# Patient Record
Sex: Male | Born: 1970 | Race: White | Hispanic: No | Marital: Married | Smoking: Never smoker
Health system: Southern US, Community
[De-identification: ages and names within clinical notes are randomized; demographics above are authoritative.]

## PROBLEM LIST (undated history)

## (undated) DIAGNOSIS — N200 Calculus of kidney: Secondary | ICD-10-CM

## (undated) DIAGNOSIS — M109 Gout, unspecified: Secondary | ICD-10-CM

## (undated) DIAGNOSIS — I10 Essential (primary) hypertension: Secondary | ICD-10-CM

---

## 2013-03-22 ENCOUNTER — Encounter (HOSPITAL_COMMUNITY): Payer: Self-pay | Admitting: Emergency Medicine

## 2013-03-22 ENCOUNTER — Emergency Department (HOSPITAL_COMMUNITY)
Admission: EM | Admit: 2013-03-22 | Discharge: 2013-03-22 | Disposition: A | Discharge status: Home / Self Care | Payer: 59 | Attending: Emergency Medicine | Admitting: Emergency Medicine

## 2013-03-22 ENCOUNTER — Emergency Department (HOSPITAL_COMMUNITY): Payer: 59

## 2013-03-22 DIAGNOSIS — N5082 Scrotal pain: Secondary | ICD-10-CM

## 2013-03-22 DIAGNOSIS — R319 Hematuria, unspecified: Secondary | ICD-10-CM | POA: Insufficient documentation

## 2013-03-22 DIAGNOSIS — Z79899 Other long term (current) drug therapy: Secondary | ICD-10-CM | POA: Insufficient documentation

## 2013-03-22 DIAGNOSIS — I10 Essential (primary) hypertension: Secondary | ICD-10-CM | POA: Insufficient documentation

## 2013-03-22 DIAGNOSIS — Z87442 Personal history of urinary calculi: Secondary | ICD-10-CM | POA: Insufficient documentation

## 2013-03-22 DIAGNOSIS — M109 Gout, unspecified: Secondary | ICD-10-CM | POA: Insufficient documentation

## 2013-03-22 DIAGNOSIS — N509 Disorder of male genital organs, unspecified: Secondary | ICD-10-CM | POA: Insufficient documentation

## 2013-03-22 DIAGNOSIS — Z880 Allergy status to penicillin: Secondary | ICD-10-CM | POA: Insufficient documentation

## 2013-03-22 DIAGNOSIS — N5089 Other specified disorders of the male genital organs: Secondary | ICD-10-CM | POA: Insufficient documentation

## 2013-03-22 HISTORY — DX: Gout, unspecified: M10.9

## 2013-03-22 HISTORY — DX: Calculus of kidney: N20.0

## 2013-03-22 HISTORY — DX: Essential (primary) hypertension: I10

## 2013-03-22 LAB — CBC WITH DIFFERENTIAL/PLATELET
BASOS ABS: 0 10*3/uL (ref 0.0–0.1)
BASOS PCT: 0 % (ref 0–1)
Eosinophils Absolute: 0.1 10*3/uL (ref 0.0–0.7)
Eosinophils Relative: 2 % (ref 0–5)
HCT: 38.6 % — ABNORMAL LOW (ref 39.0–52.0)
Hemoglobin: 13.9 g/dL (ref 13.0–17.0)
LYMPHS PCT: 24 % (ref 12–46)
Lymphs Abs: 1.8 10*3/uL (ref 0.7–4.0)
MCH: 32.3 pg (ref 26.0–34.0)
MCHC: 36 g/dL (ref 30.0–36.0)
MCV: 89.6 fL (ref 78.0–100.0)
MONOS PCT: 12 % (ref 3–12)
Monocytes Absolute: 0.9 10*3/uL (ref 0.1–1.0)
NEUTROS ABS: 4.8 10*3/uL (ref 1.7–7.7)
NEUTROS PCT: 62 % (ref 43–77)
Platelets: 280 10*3/uL (ref 150–400)
RBC: 4.31 MIL/uL (ref 4.22–5.81)
RDW: 12.3 % (ref 11.5–15.5)
WBC: 7.6 10*3/uL (ref 4.0–10.5)

## 2013-03-22 LAB — URINE MICROSCOPIC-ADD ON

## 2013-03-22 LAB — URINALYSIS, ROUTINE W REFLEX MICROSCOPIC
Bilirubin Urine: NEGATIVE
Glucose, UA: NEGATIVE mg/dL
Ketones, ur: NEGATIVE mg/dL
Leukocytes, UA: NEGATIVE
NITRITE: NEGATIVE
PH: 5.5 (ref 5.0–8.0)
Protein, ur: NEGATIVE mg/dL
SPECIFIC GRAVITY, URINE: 1.017 (ref 1.005–1.030)
Urobilinogen, UA: 0.2 mg/dL (ref 0.0–1.0)

## 2013-03-22 LAB — BASIC METABOLIC PANEL
BUN: 19 mg/dL (ref 6–23)
CHLORIDE: 96 meq/L (ref 96–112)
CO2: 25 mEq/L (ref 19–32)
Calcium: 10 mg/dL (ref 8.4–10.5)
Creatinine, Ser: 0.95 mg/dL (ref 0.50–1.35)
GFR calc Af Amer: >90 mL/min (ref >90)
GFR calc non Af Amer: >90 mL/min (ref >90)
Glucose, Bld: 87 mg/dL (ref 70–99)
POTASSIUM: 4 meq/L (ref 3.7–5.3)
Sodium: 137 mEq/L (ref 137–147)

## 2013-03-22 MED ORDER — OXYCODONE-ACETAMINOPHEN 5-325 MG PO TABS: 2.0000 | ORAL_TABLET | ORAL | Status: AC | PRN

## 2013-03-22 NOTE — ED Provider Notes (Signed)
CSN: [id number]     Arrival date & time 03/22/13  1744 History   First MD Initiated Contact with Patient 03/22/13 1900     Chief Complaint  Patient presents with  . Groin Swelling    left     (Consider location/radiation/quality/duration/timing/severity/associated sxs/prior Treatment) HPI Comments: Patient is a 43 year old male who presents with a 3 day history of left scrotal swelling. Symptoms started gradually and progressively worsened since the onset. Patient denies any pain but does report "discomfort" in the area. Patient has not tried anything for symptom relief. No aggravating/alleviating factors. Patient denies any associated symptoms.    Past Medical History  Diagnosis Date  . Kidney stone   . Hypertension   . Gout    History reviewed. No pertinent past surgical history. No family history on file. History  Substance Use Topics  . Smoking status: Never Smoker   . Smokeless tobacco: Not on file  . Alcohol Use: No     Comment: occasion    Review of Systems  Constitutional: Negative for fever, chills and fatigue.  HENT: Negative for trouble swallowing.   Eyes: Negative for visual disturbance.  Respiratory: Negative for shortness of breath.   Cardiovascular: Negative for chest pain and palpitations.  Gastrointestinal: Negative for nausea, vomiting, abdominal pain and diarrhea.  Genitourinary: Positive for scrotal swelling. Negative for dysuria and difficulty urinating.  Musculoskeletal: Negative for arthralgias and neck pain.  Skin: Negative for color change.  Neurological: Negative for dizziness and weakness.  Psychiatric/Behavioral: Negative for dysphoric mood.      Allergies  Penicillins  Home Medications   Current Outpatient Rx  Name  Route  Sig  Dispense  Refill  . allopurinol (ZYLOPRIM) 300 MG tablet   Oral   Take 300 mg by mouth daily.         Marland Kitchen. COLCRYS 0.6 MG tablet   Oral   Take 0.6 mg by mouth daily.          Marland Kitchen. lisinopril  (PRINIVIL,ZESTRIL) 40 MG tablet   Oral   Take 40 mg by mouth daily.          BP 138/89  Pulse 86  Temp(Src) 97.9 F (36.6 C) (Oral)  Resp 18  SpO2 99% Physical Exam  Nursing note and vitals reviewed. Constitutional: He is oriented to person, place, and time. He appears well-developed and well-nourished. No distress.  HENT:  Head: Normocephalic and atraumatic.  Eyes: Conjunctivae and EOM are normal.  Neck: Normal range of motion.  Cardiovascular: Normal rate and regular rhythm.  Exam reveals no gallop and no friction rub.   No murmur heard. Pulmonary/Chest: Effort normal and breath sounds normal. He has no wheezes. He has no rales. He exhibits no tenderness.  Abdominal: Soft. He exhibits no distension. There is no tenderness. There is no rebound and no guarding.  Genitourinary: Penis normal.  Left testicular swelling without tenderness to palpation. Unremarkable right testicle.   Musculoskeletal: Normal range of motion.  Neurological: He is alert and oriented to person, place, and time.  Speech is goal-oriented. Moves limbs without ataxia.   Skin: Skin is warm and dry.  Psychiatric: He has a normal mood and affect. His behavior is normal.    ED Course  Procedures (including critical care time) Labs Review Labs Reviewed  CBC WITH DIFFERENTIAL - Abnormal; Notable for the following:    HCT 38.6 (*)    All other components within normal limits  URINALYSIS, ROUTINE W REFLEX MICROSCOPIC - Abnormal; Notable  for the following:    Hgb urine dipstick LARGE (*)    All other components within normal limits  BASIC METABOLIC PANEL  URINE MICROSCOPIC-ADD ON   Imaging Review US Scrotum  03/22/2013   CLINICAL DATA:  Left scrotal pain and swelling.  EXAM: SCROTAL ULTRASOUND  DOPPLER ULTRASOUND OF THE TESTICLES  TECHNIQUE: Complete ultrasound examination of the testicles, epididymis, and other scrotal structures was performed. Color and spectral Doppler ultrasound were also utilized to  evaluate blood flow to the testicles.  COMPARISON:  None.  FINDINGS: Right testicle  Measurements: Approximately 4.2 x 2.6 x 3.0 cm. Normal parenchymal echotexture without mass or microlithiasis. Normal color Doppler flow without evidence of hyperemia.  Left testicle  Measurements: Approximately 4.3 x 2.4 x 2.6 cm. Normal parenchymal echotexture without mass or microlithiasis. Normal color Doppler flow without evidence of hyperemia.  Right epididymis: Normal in size and appearance without evidence of hyperemia.  Left epididymis: Normal in size and appearance without evidence of hyperemia.  Hydrocele:  None visualized.  Varicocele:  None visualized.  Pulsed Doppler interrogation of both testes demonstrates normal low resistance arterial and venous waveforms bilaterally.  IMPRESSION: Normal examination. Specifically, no evidence of testicular torsion or epididymo-orchitis.   Electronically Signed   By: Hulan Saas M.D.   On: 03/22/2013 20:33   [place] Art/ven Flow Abd Pelv Doppler  03/22/2013   CLINICAL DATA:  Left scrotal pain and swelling.  EXAM: SCROTAL ULTRASOUND  DOPPLER ULTRASOUND OF THE TESTICLES  TECHNIQUE: Complete ultrasound examination of the testicles, epididymis, and other scrotal structures was performed. Color and spectral Doppler ultrasound were also utilized to evaluate blood flow to the testicles.  COMPARISON:  None.  FINDINGS: Right testicle  Measurements: Approximately 4.2 x 2.6 x 3.0 cm. Normal parenchymal echotexture without mass or microlithiasis. Normal color Doppler flow without evidence of hyperemia.  Left testicle  Measurements: Approximately 4.3 x 2.4 x 2.6 cm. Normal parenchymal echotexture without mass or microlithiasis. Normal color Doppler flow without evidence of hyperemia.  Right epididymis: Normal in size and appearance without evidence of hyperemia.  Left epididymis: Normal in size and appearance without evidence of hyperemia.  Hydrocele:  None visualized.  Varicocele:  None  visualized.  Pulsed Doppler interrogation of both testes demonstrates normal low resistance arterial and venous waveforms bilaterally.  IMPRESSION: Normal examination. Specifically, no evidence of testicular torsion or epididymo-orchitis.   Electronically Signed   By: Hulan Saas M.D.   On: 03/22/2013 20:33    EKG Interpretation   None       MDM   Final diagnoses:  Scrotum pain  Hematuria    7:18 PM Scrotum US pending. Vitals stable and patient afebrile. Patient declines pain medication at this time.   9:14 PM Scrotum US unremarkable for acute changes. Urinalysis shows large hemoglobin. Patient complaining of some LLQ pain after further questions. Patient may have a kidney stone but does not want to have a CT scan. Patient will be discharged with Percocet for pain as needed, Urology follow up, and instructions to return with worsening or concerning symptoms. No further evaluation needed at this time.   Emilia Beck, [place] 03/27/13 [phone]

## 2013-03-22 NOTE — Discharge Instructions (Signed)
Take Percocet as needed for pain. Follow up with [facility] for further evaluation. Refer to attached documents for more information. Return to the ED with worsening or concerning symptoms.

## 2013-03-22 NOTE — ED Notes (Signed)
Pt states that when he woke up two days ago he noticed left groin swelling. Pt took ibuprofen and thought it went down and was better but it's not. Pt states that painful when in sitting position. Pt denies any problems with urination, just doesn't feel that he is emptying his bladder all the way so has frequency with urination. Pt states that he also has constipation.

## 2013-03-27 NOTE — ED Provider Notes (Signed)
Medical screening examination/treatment/procedure(s) were performed by non-physician practitioner and as supervising physician I was immediately available for consultation/collaboration.  EKG Interpretation   None         Candyce ChurnJohn David Chasitee Zenker III, MD 03/27/13 [phone]

## 2013-04-06 ENCOUNTER — Telehealth (HOSPITAL_COMMUNITY): Payer: Self-pay

## 2013-04-06 NOTE — ED Notes (Signed)
Pt calling for information on how to access his My Chart reports doesn't have DC paperwork.  Provided pt w/My chart support line # [phone]

## 2015-01-31 IMAGING — US US SCROTUM
1 series · 14 of 25 positions shown · non-contrast
Comparison: None.

CLINICAL DATA: Left scrotal pain and swelling.

EXAM:
SCROTAL ULTRASOUND
DOPPLER ULTRASOUND OF THE TESTICLES
TECHNIQUE: Complete ultrasound examination of the testicles, epididymis, and
other scrotal structures was performed. Color and spectral Doppler
ultrasound were also utilized to evaluate blood flow to the
testicles.

[Series 1: us scrotum · 0.07mm/px · 14 of 55 slices shown]
[im 1/55]
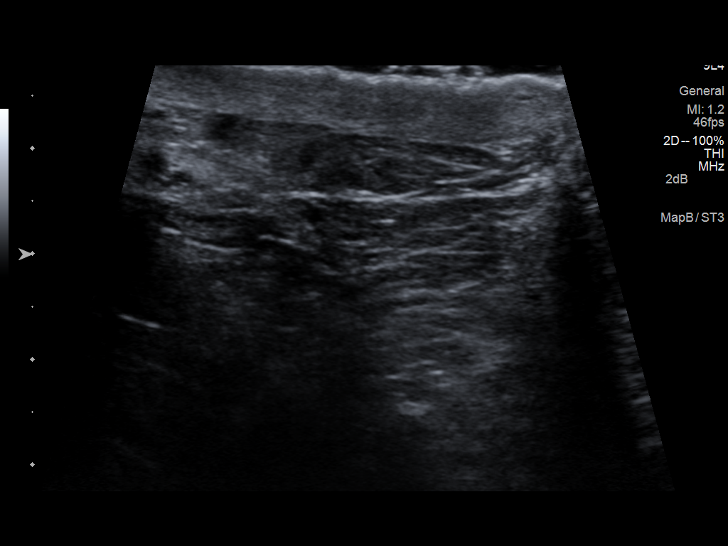
[im 5/55]
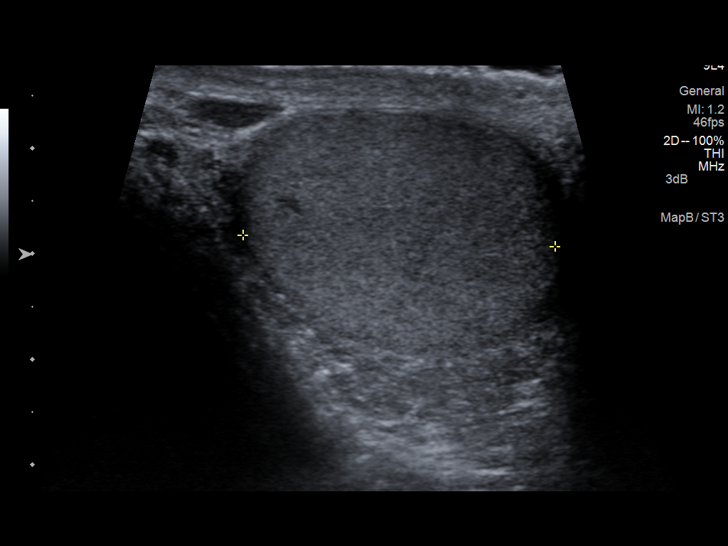
[im 10/55]
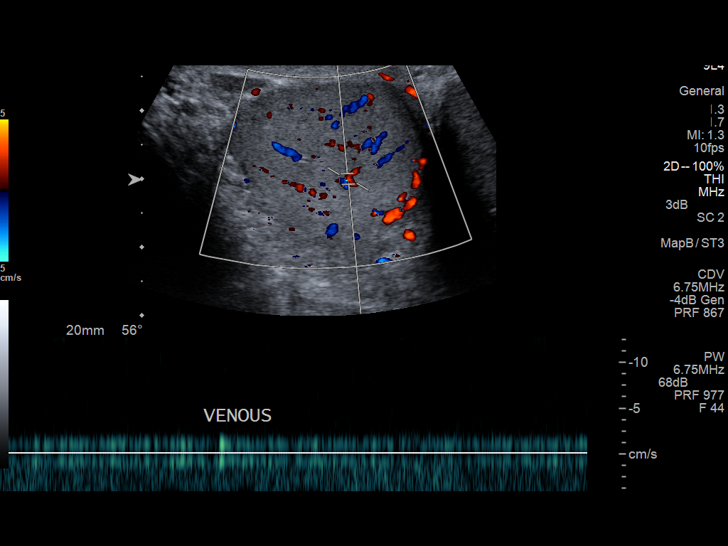
[im 14/55]
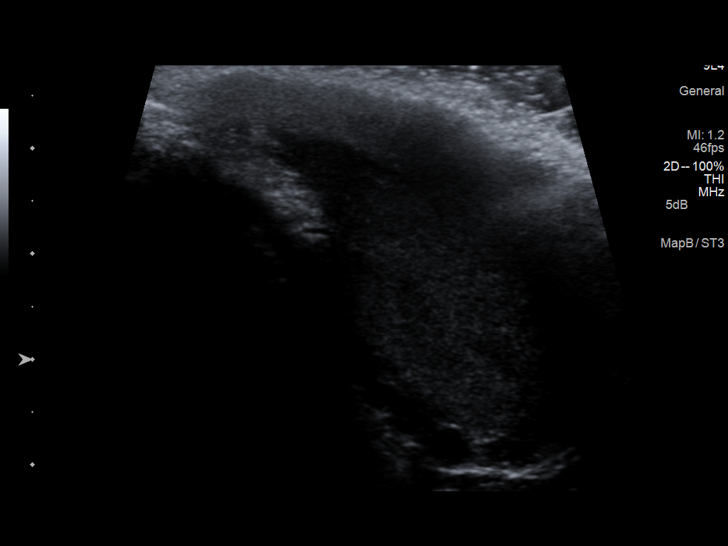
[im 19/55]
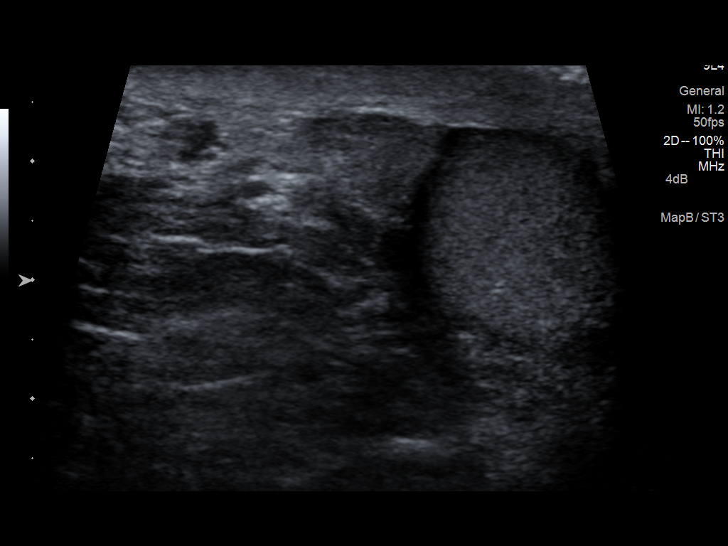
[im 21/55]
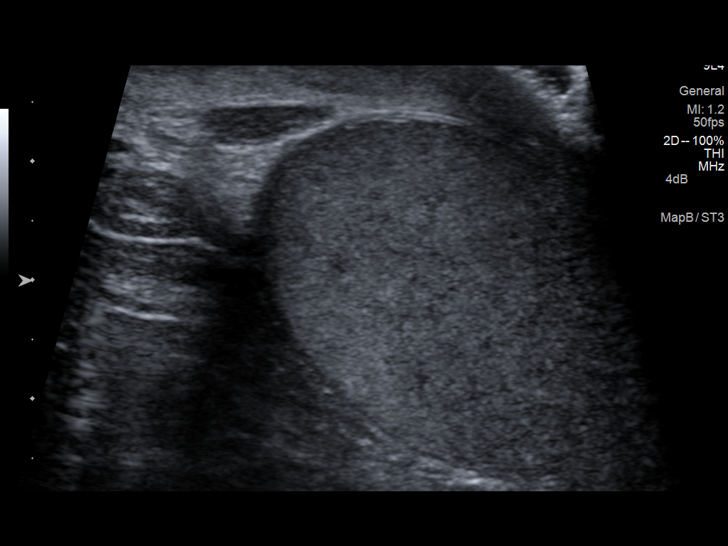
[im 25/55]
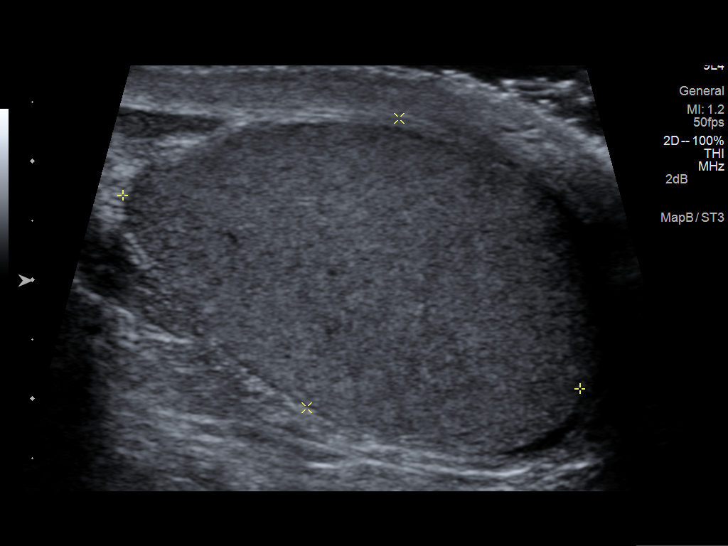
[im 30/55]
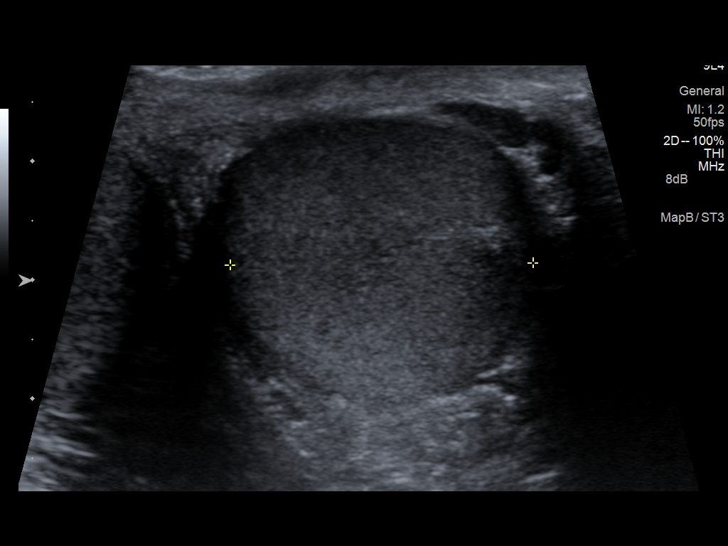
[im 34/55]
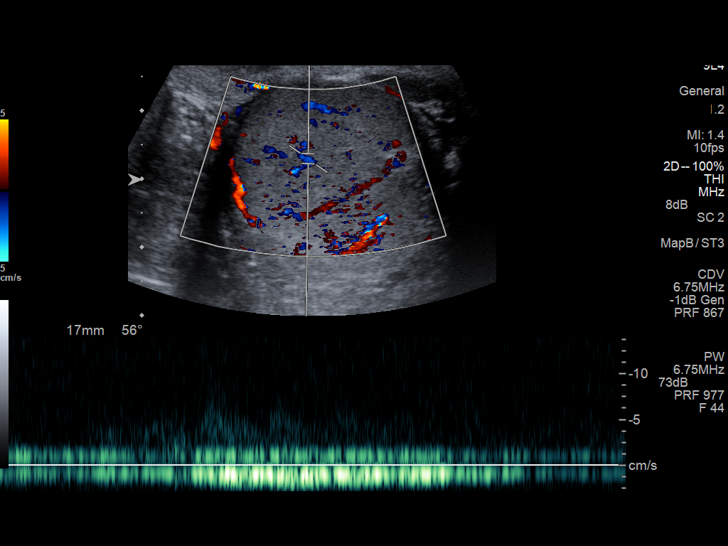
[im 37/55]
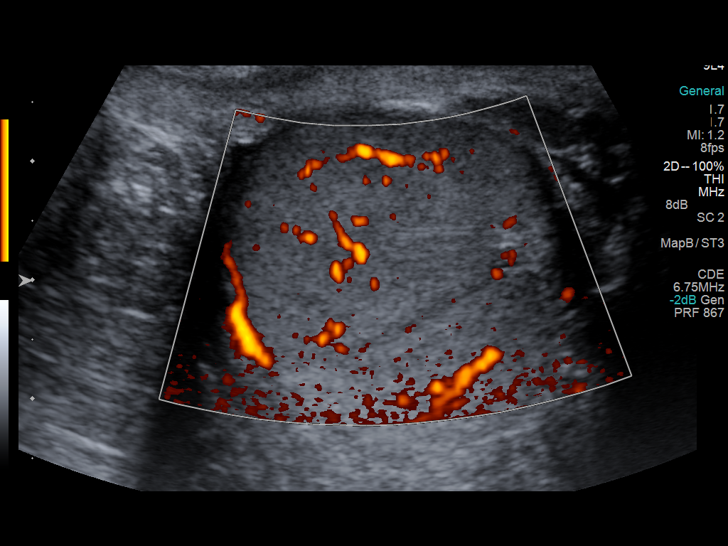
[im 41/55]
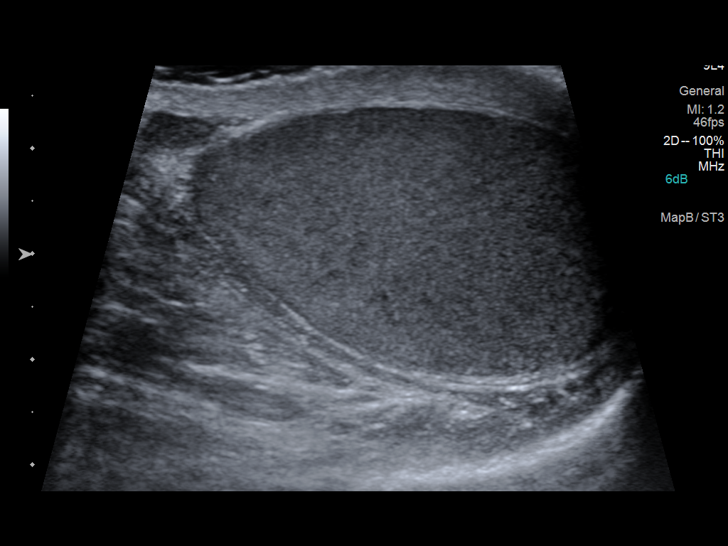
[im 46/55]
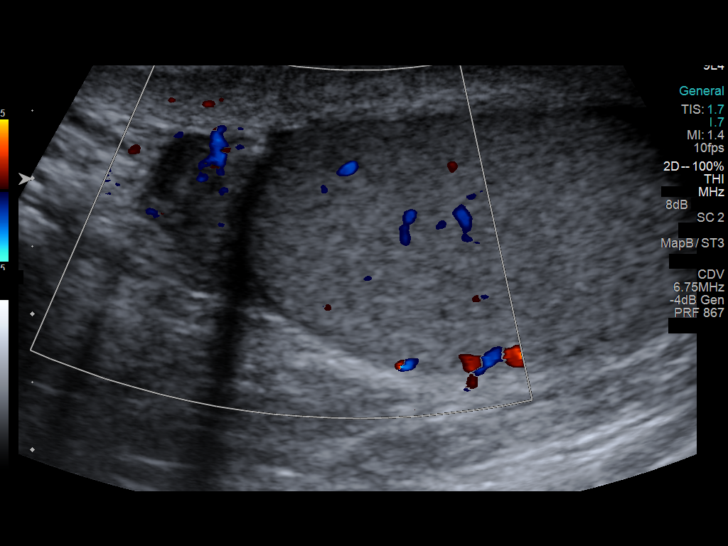
[im 50/55]
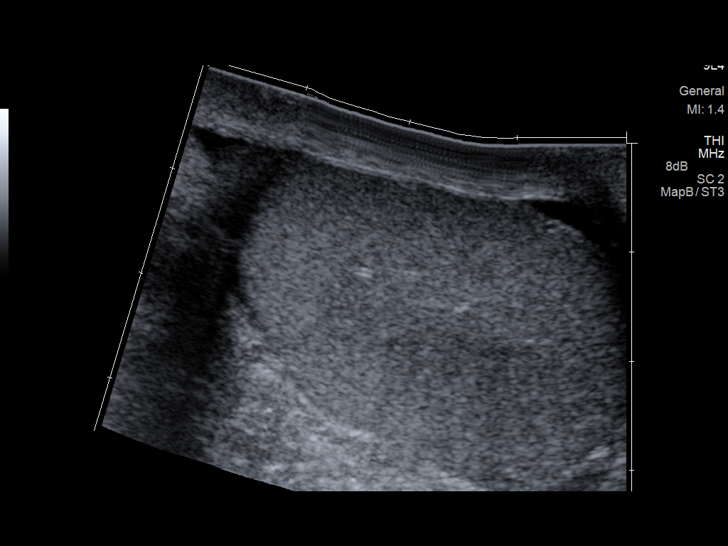
[im 55/55]
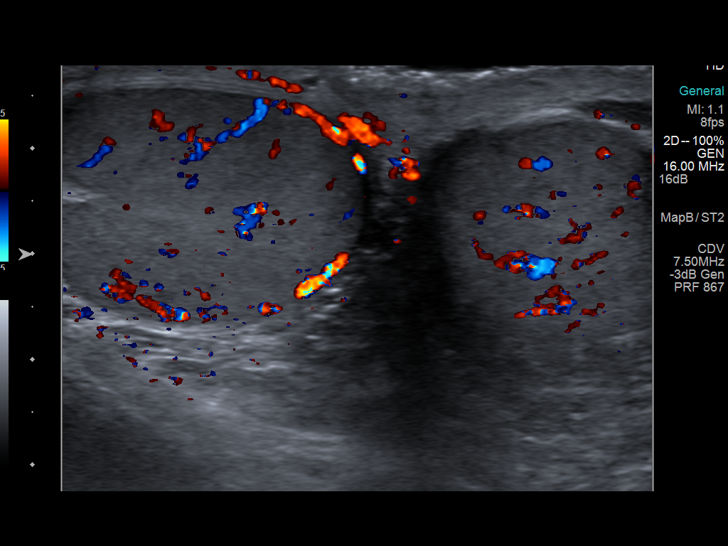

[14 of 25 positions shown; findings below may reference images not displayed]

FINDINGS: Right testicle

Measurements: Approximately 4.2 x 2.6 x 3.0 cm. Normal parenchymal
echotexture without mass or microlithiasis. Normal color Doppler
flow without evidence of hyperemia.

Left testicle

Measurements: Approximately 4.3 x 2.4 x 2.6 cm. Normal parenchymal
echotexture without mass or microlithiasis. Normal color Doppler
flow without evidence of hyperemia.

Right epididymis: Normal in size and appearance without evidence of
hyperemia.

Left epididymis: Normal in size and appearance without evidence of
hyperemia.

Hydrocele:  None visualized.

Varicocele:  None visualized.

Pulsed Doppler interrogation of both testes demonstrates normal low
resistance arterial and venous waveforms bilaterally.
IMPRESSION: Normal examination. Specifically, no evidence of testicular torsion
or epididymo-orchitis.
